# Patient Record
Sex: Male | Born: 1950
Health system: Southern US, Community
[De-identification: ages and names within clinical notes are randomized; demographics above are authoritative.]

## PROBLEM LIST (undated history)

## (undated) DIAGNOSIS — I10 Essential (primary) hypertension: Secondary | ICD-10-CM

---

## 2017-03-10 LAB — HM COLONOSCOPY

## 2017-10-22 ENCOUNTER — Encounter (HOSPITAL_COMMUNITY): Payer: Self-pay | Admitting: *Deleted

## 2017-10-22 ENCOUNTER — Other Ambulatory Visit: Payer: Self-pay

## 2017-10-22 DIAGNOSIS — M436 Torticollis: Secondary | ICD-10-CM | POA: Insufficient documentation

## 2017-10-22 DIAGNOSIS — M62838 Other muscle spasm: Secondary | ICD-10-CM | POA: Diagnosis not present

## 2017-10-22 DIAGNOSIS — I10 Essential (primary) hypertension: Secondary | ICD-10-CM | POA: Diagnosis not present

## 2017-10-22 DIAGNOSIS — M542 Cervicalgia: Secondary | ICD-10-CM | POA: Diagnosis present

## 2017-10-22 NOTE — ED Triage Notes (Signed)
Pt says he woke up with right sided neck pain. He took tylenol for pain without relief. Pain worse to moving his head right to left.

## 2017-10-23 ENCOUNTER — Emergency Department (HOSPITAL_COMMUNITY)
Admission: EM | Admit: 2017-10-23 | Discharge: 2017-10-23 | Disposition: A | Discharge status: Home / Self Care | Payer: Medicare Other | Attending: Emergency Medicine | Admitting: Emergency Medicine

## 2017-10-23 DIAGNOSIS — M62838 Other muscle spasm: Secondary | ICD-10-CM

## 2017-10-23 DIAGNOSIS — M436 Torticollis: Secondary | ICD-10-CM

## 2017-10-23 HISTORY — DX: Essential (primary) hypertension: I10

## 2017-10-23 MED ORDER — NAPROXEN SODIUM 220 MG PO TABS: 220.0000 mg | ORAL_TABLET | Freq: Two times a day (BID) | ORAL | 0 refills | Status: AC

## 2017-10-23 MED ORDER — IBUPROFEN 200 MG PO TABS
600.0000 mg | ORAL_TABLET | Freq: Once | ORAL | Status: AC | PRN
  Administered 2017-10-23: 600 mg via ORAL

## 2017-10-23 MED ORDER — NAPROXEN SODIUM 220 MG PO TABS: 220.0000 mg | ORAL_TABLET | Freq: Two times a day (BID) | ORAL | Status: DC

## 2017-10-23 MED ORDER — CYCLOBENZAPRINE HCL 5 MG PO TABS: 5.0000 mg | ORAL_TABLET | Freq: Every day | ORAL | 0 refills | Status: AC

## 2017-10-23 MED ORDER — IBUPROFEN 200 MG PO TABS
ORAL_TABLET | ORAL | Status: AC
  Administered 2017-10-23: 600 mg via ORAL
  Filled 2017-10-23: qty 3

## 2017-10-23 NOTE — ED Provider Notes (Signed)
[facility]-EMERGENCY DEPT Provider Note  CSN: [id number] Arrival date & time: 10/22/17 2324  Chief Complaint(s) Neck Pain  HPI Scott Saunders is a 67 y.o. male with a history of hypertension presents to the emergency department with approximately 20 to 24 hours of right-sided neck and shoulder pain.  He reports that it is achy and spastic in nature.  States that he noticed it when he awoke yesterday morning.  Believes it was due to working out the day before and sleeping on a soft mattress had a hotel.  Pain is exacerbated with turning his head and palpation of right shoulder musculature.  Patient tried taking Tylenol with minimal relief.  Denies any fevers or chills.  No headache.  No focal deficits.  No chest pain or shortness of breath.  Denies any other physical complaints.  HPI  Past Medical History Past Medical History:  Diagnosis Date  . Hypertension    There are no active problems to display for this patient.  Home Medication(s) Prior to Admission medications   Medication Sig Start Date End Date Taking? Authorizing Provider  cyclobenzaprine (FLEXERIL) 5 MG tablet Take 1 tablet (5 mg total) by mouth at bedtime for 10 days. 10/23/17 11/02/17  Nira Conn, MD  naproxen sodium (ALEVE) 220 MG tablet Take 1 tablet (220 mg total) by mouth 2 (two) times daily with a meal for 10 days. 10/23/17 11/02/17  Nira Conn, MD                                                                                                                                    Past Surgical History History reviewed. No pertinent surgical history. Family History No family history on file.  Social History Social History   Tobacco Use  . Smoking status: Never Smoker  . Smokeless tobacco: Never Used  Substance Use Topics  . Alcohol use: Never    Frequency: Never  . Drug use: Never   Allergies Patient has no known allergies.  Review of Systems Review of Systems As  noted in HPI Physical Exam Vital Signs  I have reviewed the triage vital signs BP (!) 182/81 (BP Location: Left Arm)   Pulse 66   Temp 98.5 F (36.9 C) (Oral)   Resp 20   SpO2 99%   Physical Exam  Constitutional: He is oriented to person, place, and time. He appears well-developed and well-nourished. No distress.  HENT:  Head: Normocephalic and atraumatic.  Right Ear: External ear normal.  Left Ear: External ear normal.  Nose: Nose normal.  Mouth/Throat: Mucous membranes are normal. No trismus in the jaw.  Eyes: Conjunctivae and EOM are normal. No scleral icterus.  Neck: Phonation normal. Muscular tenderness (spastic muscle) present. No spinous process tenderness present. Decreased range of motion (due to pain) present.    Cardiovascular: Normal rate and regular rhythm.  Pulmonary/Chest: Effort normal. No stridor. No respiratory distress.  Abdominal:  He exhibits no distension.  Musculoskeletal: He exhibits no edema.  Neurological: He is alert and oriented to person, place, and time.  Skin: He is not diaphoretic.  Psychiatric: He has a normal mood and affect. His behavior is normal.  Vitals reviewed.   ED Results and Treatments Labs (all labs ordered are listed, but only abnormal results are displayed) Labs Reviewed - No data to display                                                                                                                       EKG  EKG Interpretation  Date/Time:    Ventricular Rate:    PR Interval:    QRS Duration:   QT Interval:    QTC Calculation:   R Axis:     Text Interpretation:        Radiology No results found. Pertinent labs & imaging results that were available during my care of the patient were reviewed by me and considered in my medical decision making (see chart for details).  Medications Ordered in ED Medications  ibuprofen (ADVIL,MOTRIN) tablet 600 mg (600 mg Oral Given 10/23/17 0236)                                                                                                                                     Procedures Procedures  (including critical care time)  Medical Decision Making / ED Course I have reviewed the nursing notes for this encounter and the patient's prior records (if available in EHR or on provided paperwork).    Most suspicious for muscle spasm of the right trapezius muscle. Exam nonfocal. Doubt dissection. No fever. Doubt meningitis.  Motrin given.  The patient appears reasonably screened and/or stabilized for discharge and I doubt any other medical condition or other [facility] requiring further screening, evaluation, or treatment in the ED at this time prior to discharge.  The patient is safe for discharge with strict return precautions.   Final Clinical Impression(s) / ED Diagnoses Final diagnoses:  Torticollis  Muscle spasms of neck    Disposition: Discharge  Condition: Good  I have discussed the results, Dx and Tx plan with the patient who expressed understanding and agree(s) with the plan. Discharge instructions discussed at great length. The patient was given strict return precautions who verbalized understanding of the instructions. No further questions at time of discharge.    ED Discharge  Orders         Ordered    naproxen sodium (ALEVE) 220 MG tablet  2 times daily with meals     10/23/17 0347    cyclobenzaprine (FLEXERIL) 5 MG tablet  Daily at bedtime     10/23/17 0347           Follow Up: Primary care provider   in 1-2 weeks, If symptoms do not improve or  worsen     This chart was dictated using voice recognition software.  Despite best efforts to proofread,  errors can occur which can change the documentation meaning.   Nira Conn, MD 10/23/17 [phone]

## 2017-10-23 NOTE — ED Notes (Addendum)
Patient complaining of right neck pain with worsening pain when he turns his head to the right. Patient states he woke up with this pain. Patient is ambulatory with steady gait.

## 2017-10-23 NOTE — Discharge Instructions (Signed)
You may use over-the-counter Motrin (Ibuprofen), Acetaminophen (Tylenol), topical muscle creams such as SalonPas, Icy Hot, Bengay, etc. Please stretch, apply heat, and have massage therapy for additional assistance. ° °

## 2017-10-23 NOTE — ED Notes (Signed)
Pt given hot packs wrapped up in a towel around his neck. Pt reports much improvement to the pain.

## 2018-04-14 ENCOUNTER — Ambulatory Visit (INDEPENDENT_AMBULATORY_CARE_PROVIDER_SITE_OTHER): Payer: Medicare Other | Admitting: Family Medicine

## 2018-04-14 ENCOUNTER — Encounter: Payer: Self-pay | Admitting: Family Medicine

## 2018-04-14 VITALS — BP 130/68 | HR 72 | Temp 97.8°F | Ht 70.0 in | Wt 209.0 lb

## 2018-04-14 DIAGNOSIS — R350 Frequency of micturition: Secondary | ICD-10-CM | POA: Diagnosis not present

## 2018-04-14 DIAGNOSIS — K635 Polyp of colon: Secondary | ICD-10-CM | POA: Diagnosis not present

## 2018-04-14 DIAGNOSIS — I10 Essential (primary) hypertension: Secondary | ICD-10-CM

## 2018-04-14 DIAGNOSIS — M1990 Unspecified osteoarthritis, unspecified site: Secondary | ICD-10-CM

## 2018-04-14 DIAGNOSIS — N529 Male erectile dysfunction, unspecified: Secondary | ICD-10-CM

## 2018-04-14 MED ORDER — SILDENAFIL CITRATE 20 MG PO TABS: 20.0000 mg | ORAL_TABLET | Freq: Every day | ORAL | 1 refills | Status: AC | PRN

## 2018-04-14 MED ORDER — MELOXICAM 15 MG PO TABS: 15.0000 mg | ORAL_TABLET | Freq: Every day | ORAL | 2 refills | Status: AC

## 2018-04-14 MED ORDER — MELOXICAM 15 MG PO TABS: 15.0000 mg | ORAL_TABLET | Freq: Every day | ORAL | 2 refills | Status: DC

## 2018-04-14 MED ORDER — SILDENAFIL CITRATE 20 MG PO TABS: 20.0000 mg | ORAL_TABLET | Freq: Every day | ORAL | 1 refills | Status: DC | PRN

## 2018-04-14 MED FILL — SILDENAFIL CITRATE 20 MG TA: 20 | 10 days supply | Qty: 10 | Fill #0

## 2018-04-14 MED FILL — MELOXICAM 15 MG TABLET: 15 | 30 days supply | Qty: 30 | Fill #0

## 2018-04-14 NOTE — Patient Instructions (Addendum)
If you do not hear anything about your referrals in the next 1-2 weeks, call our office and ask for an update.  Keep the diet clean and stay active.  Let us know if you need anything. 

## 2018-04-14 NOTE — Progress Notes (Signed)
Chief Complaint  Patient presents with  . New Patient (Initial Visit)       New Patient Visit SUBJECTIVE: HPI: Scott Saunders is an 68 y.o.male who is being seen for establishing care.  The patient was previously seen in [place]  Pt has hx of freq urination. He was rx'd Ditropan XL 10 mg/d, but has not been taking it. He saw urology in [place], and wishes to see them down here. No bleeding or hx of BPH.   Hypertension Patient presents for hypertension follow up. He does not routinely monitor home blood pressures. He is compliant with medication-Norvasc 10 mg/d. Patient has these side effects of medication: none Denies CP or SOB  Pt has a hx of arthritis in many of his joints. He takes Tylenol and sometimes Aleve. No swelling.   Hx of ED. Take Sildenafil prn. No AE"s with medication.  Hx of   No Known Allergies  Past Medical History:  Diagnosis Date  . Hypertension    History reviewed. No pertinent surgical history. History reviewed. No pertinent family history. No Known Allergies  Current Outpatient Medications:  .  amLODipine (NORVASC) 10 MG tablet, Take 10 mg by mouth daily., Disp: , Rfl:  .  Multiple Vitamin (MULTIVITAMIN) tablet, Take 1 tablet by mouth daily., Disp: , Rfl:  .  oxybutynin (DITROPAN-XL) 10 MG 24 hr tablet, Take 10 mg by mouth at bedtime., Disp: , Rfl:  .  sildenafil (REVATIO) 20 MG tablet, Take 1 tablet (20 mg total) by mouth daily as needed., Disp: 10 tablet, Rfl: 1 .  meloxicam (MOBIC) 15 MG tablet, Take 1 tablet (15 mg total) by mouth daily., Disp: 30 tablet, Rfl: 2  ROS Const: no fevers MSK: +jt pain Neuro: No weakness Cardio: No CP Lungs: No sob GU: +freq GI: No bleeding Skin: No redness Eyes: No vision changes Endo: No wt loss  OBJECTIVE: BP 130/68 (BP Location: Left Arm, Patient Position: Sitting, Cuff Size: Normal)   Pulse 72   Temp 97.8 F (36.6 C) (Oral)   Ht 5\' 10"  (1.778 m)   Wt 209 lb (94.8 kg)   SpO2 97%   BMI 29.99 kg/m    Constitutional: -  VS reviewed -  Well developed, well nourished, appears stated age -  No apparent distress  Psychiatric: -  Oriented to person, place, and time -  Memory intact -  Affect and mood normal -  Fluent conversation, good eye contact -  Judgment and insight age appropriate  Eye: -  Conjunctivae clear, no discharge -  Pupils symmetric, round, reactive to light  ENMT: -  MMM    Pharynx moist, no exudate, no erythema  Neck: -  No gross swelling, no palpable masses -  Thyroid midline, not enlarged, mobile, no palpable masses  Cardiovascular: -  RRR -  No LE edema  Respiratory: -  Normal respiratory effort, no accessory muscle use, no retraction -  Breath sounds equal, no wheezes, no ronchi, no crackles  Gastrointestinal: -  Bowel sounds normal -  No tenderness, no distention, no guarding, no masses  Neurological:  -  CN II - XII grossly intact -  Sensation grossly intact to light touch, equal bilaterally  Musculoskeletal: -  No clubbing, no cyanosis -  Gait normal  Skin: -  No significant lesion on inspection -  Warm and dry to palpation   ASSESSMENT/PLAN: Arthritis - Plan: meloxicam (MOBIC) 15 MG tablet  Urinary frequency - Plan: oxybutynin (DITROPAN-XL) 10 MG 24 hr tablet, Ambulatory  referral to Urology  Erectile dysfunction, unspecified erectile dysfunction type - Plan: sildenafil (REVATIO) 20 MG tablet  Polyp of colon, unspecified part of colon, unspecified type - Plan: Ambulatory referral to Gastroenterology  Essential hypertension - Plan: amLODipine (NORVASC) 10 MG tablet  Patient instructed to sign release of records form from his previous PCP. Mobic prn, Tylenol. Physical activity encouraged.  Refer to urology per his request. Refill sildenafil. Refer to GI to f/u on abn colonoscopy (per pt). I told him that team will want his records.  Cont Norvasc. Counseled on diet and exercise.  Patient should return in 6 mo for CPE. The patient voiced  understanding and agreement to the plan.   Jilda Roche [place], DO 04/14/18  3:53 PM

## 2018-04-14 NOTE — Addendum Note (Signed)
Addended by: Scharlene Gloss B on: 04/14/2018 04:18 PM   Modules accepted: Orders

## 2018-04-26 ENCOUNTER — Telehealth: Payer: Self-pay | Admitting: Family Medicine

## 2018-04-26 NOTE — Telephone Encounter (Signed)
ROI fax to Dr Hoy Register for records

## 2018-05-11 ENCOUNTER — Encounter: Payer: Self-pay | Admitting: Family Medicine

## 2018-05-11 ENCOUNTER — Telehealth: Payer: Self-pay | Admitting: *Deleted

## 2018-05-11 NOTE — Telephone Encounter (Signed)
Received Medical records from Dr. Reesa Chew, MD, [facility], [facility]; forwarded to provider/SLS 03/25

## 2018-05-19 ENCOUNTER — Encounter: Payer: Self-pay | Admitting: Family Medicine

## 2018-05-26 MED FILL — SILDENAFIL CITRATE 20 MG TA: 20 | 10 days supply | Qty: 10 | Fill #1

## 2018-05-26 MED FILL — MELOXICAM 15 MG TABLET: 15 | 30 days supply | Qty: 30 | Fill #1

## 2018-05-31 MED FILL — OXYBUTYNIN CL ER 15 MG TAB: 15 | 30 days supply | Qty: 30 | Fill #0

## 2018-05-31 MED FILL — SILDENAFIL CITRATE 20 MG TA: 20 | 15 days supply | Qty: 30 | Fill #0

## 2018-07-05 ENCOUNTER — Other Ambulatory Visit: Payer: Self-pay | Admitting: Family Medicine

## 2018-07-05 DIAGNOSIS — I10 Essential (primary) hypertension: Secondary | ICD-10-CM

## 2018-07-05 MED ORDER — AMLODIPINE BESYLATE 10 MG PO TABS: 10.0000 mg | ORAL_TABLET | Freq: Every day | ORAL | 1 refills | Status: AC

## 2018-07-05 MED FILL — AMLODIPINE BESYLATE 10 MG T: 10 | 90 days supply | Qty: 90 | Fill #0

## 2018-07-05 NOTE — Telephone Encounter (Signed)
Copied from CRM [id number]. Topic: Quick Communication - Rx Refill/Question >> Jul 05, 2018 10:29 AM Baldo Daub L wrote: Medication: amLODipine (NORVASC) 10 MG tablet  Has the patient contacted their pharmacy? Yes - states he needs a new refill, he is completely out (Agent: If no, request that the patient contact the pharmacy for the refill.) (Agent: If yes, when and what did the pharmacy advise?)  Preferred Pharmacy (with phone number or street name): [facility] - [place], [place] - [phone] (Phone) [phone] (Fax)  Agent: Please be advised that RX refills may take up to 3 business days. We ask that you follow-up with your pharmacy.

## 2018-07-05 NOTE — Telephone Encounter (Signed)
Rx sent 

## 2018-07-19 MED FILL — OXYBUTYNIN CL ER 15 MG TAB: 15 | 30 days supply | Qty: 30 | Fill #1

## 2018-09-08 MED FILL — LISINOPRIL 5 MG TABLET: 5 | 90 days supply | Qty: 90 | Fill #0

## 2018-09-09 MED FILL — SILDENAFIL CITRATE 20 MG TA: 20 | 15 days supply | Qty: 30 | Fill #1

## 2018-10-13 ENCOUNTER — Ambulatory Visit: Payer: Medicare Other | Admitting: Family Medicine

## 2018-11-04 MED FILL — OXYBUTYNIN CL ER 15 MG TAB: 15 | 30 days supply | Qty: 30 | Fill #2

## 2018-11-04 MED FILL — GABAPENTIN 100 MG CAPSULE: 100 | 30 days supply | Qty: 90 | Fill #0

## 2018-11-04 MED FILL — HYDROCORTISONE 2.5% CREAM: 2.5 | 14 days supply | Qty: 30 | Fill #0

## 2018-11-05 ENCOUNTER — Emergency Department (HOSPITAL_BASED_OUTPATIENT_CLINIC_OR_DEPARTMENT_OTHER): Payer: Medicare Other

## 2018-11-05 ENCOUNTER — Other Ambulatory Visit: Payer: Self-pay

## 2018-11-05 ENCOUNTER — Encounter (HOSPITAL_BASED_OUTPATIENT_CLINIC_OR_DEPARTMENT_OTHER): Payer: Self-pay | Admitting: Emergency Medicine

## 2018-11-05 ENCOUNTER — Emergency Department (HOSPITAL_BASED_OUTPATIENT_CLINIC_OR_DEPARTMENT_OTHER)
Admission: EM | Admit: 2018-11-05 | Discharge: 2018-11-05 | Disposition: A | Discharge status: Home / Self Care | Payer: Medicare Other | Attending: Emergency Medicine | Admitting: Emergency Medicine

## 2018-11-05 DIAGNOSIS — I10 Essential (primary) hypertension: Secondary | ICD-10-CM | POA: Insufficient documentation

## 2018-11-05 DIAGNOSIS — Z7982 Long term (current) use of aspirin: Secondary | ICD-10-CM | POA: Insufficient documentation

## 2018-11-05 DIAGNOSIS — R51 Headache: Secondary | ICD-10-CM | POA: Diagnosis present

## 2018-11-05 DIAGNOSIS — Z79899 Other long term (current) drug therapy: Secondary | ICD-10-CM | POA: Diagnosis not present

## 2018-11-05 DIAGNOSIS — R42 Dizziness and giddiness: Secondary | ICD-10-CM | POA: Diagnosis not present

## 2018-11-05 DIAGNOSIS — G518 Other disorders of facial nerve: Secondary | ICD-10-CM | POA: Diagnosis not present

## 2018-11-05 LAB — URINALYSIS, ROUTINE W REFLEX MICROSCOPIC
Bilirubin Urine: NEGATIVE
Glucose, UA: NEGATIVE mg/dL
Ketones, ur: NEGATIVE mg/dL
Leukocytes,Ua: NEGATIVE
Nitrite: NEGATIVE
Protein, ur: NEGATIVE mg/dL
Specific Gravity, Urine: <1.005 — ABNORMAL LOW (ref 1.005–1.030)
pH: 7 (ref 5.0–8.0)

## 2018-11-05 LAB — BASIC METABOLIC PANEL
Anion gap: 10 (ref 5–15)
BUN: 17 mg/dL (ref 8–23)
CO2: 25 mmol/L (ref 22–32)
Calcium: 9.4 mg/dL (ref 8.9–10.3)
Chloride: 102 mmol/L (ref 98–111)
Creatinine, Ser: 1.28 mg/dL — ABNORMAL HIGH (ref 0.61–1.24)
GFR calc Af Amer: >60 mL/min (ref >60)
GFR calc non Af Amer: 56 mL/min — ABNORMAL LOW (ref >60)
Glucose, Bld: 111 mg/dL — ABNORMAL HIGH (ref 70–99)
Potassium: 3.8 mmol/L (ref 3.5–5.1)
Sodium: 137 mmol/L (ref 135–145)

## 2018-11-05 LAB — CBC WITH DIFFERENTIAL/PLATELET
Abs Immature Granulocytes: 0 K/uL (ref 0.00–0.07)
Basophils Absolute: 0 K/uL (ref 0.0–0.1)
Basophils Relative: 0 %
Eosinophils Absolute: 0.1 K/uL (ref 0.0–0.5)
Eosinophils Relative: 1 %
HCT: 36.4 % — ABNORMAL LOW (ref 39.0–52.0)
Hemoglobin: 11.6 g/dL — ABNORMAL LOW (ref 13.0–17.0)
Immature Granulocytes: 0 %
Lymphocytes Relative: 57 %
Lymphs Abs: 3.4 K/uL (ref 0.7–4.0)
MCH: 31.9 pg (ref 26.0–34.0)
MCHC: 31.9 g/dL (ref 30.0–36.0)
MCV: 100 fL (ref 80.0–100.0)
Monocytes Absolute: 0.5 K/uL (ref 0.1–1.0)
Monocytes Relative: 7 %
Neutro Abs: 2.2 K/uL (ref 1.7–7.7)
Neutrophils Relative %: 35 %
Platelets: 233 K/uL (ref 150–400)
RBC: 3.64 MIL/uL — ABNORMAL LOW (ref 4.22–5.81)
RDW: 11.5 % (ref 11.5–15.5)
WBC: 6.2 K/uL (ref 4.0–10.5)
nRBC: 0 % (ref 0.0–0.2)

## 2018-11-05 LAB — URINALYSIS, MICROSCOPIC (REFLEX)
Bacteria, UA: NONE SEEN
Squamous Epithelial / HPF: NONE SEEN (ref 0–5)
WBC, UA: NONE SEEN WBC/hpf (ref 0–5)

## 2018-11-05 LAB — CBG MONITORING, ED: Glucose-Capillary: 95 mg/dL (ref 70–99)

## 2018-11-05 LAB — SEDIMENTATION RATE: Sed Rate: 9 mm/h (ref 0–16)

## 2018-11-05 MED ORDER — DIAZEPAM 2 MG PO TABS
2.0000 mg | ORAL_TABLET | Freq: Once | ORAL | Status: AC
  Administered 2018-11-05: 2 mg via ORAL
  Filled 2018-11-05: qty 1

## 2018-11-05 MED ORDER — MECLIZINE HCL 25 MG PO TABS: 25.0000 mg | ORAL_TABLET | Freq: Three times a day (TID) | ORAL | 0 refills | Status: AC | PRN

## 2018-11-05 MED ORDER — DIPHENHYDRAMINE HCL 50 MG/ML IJ SOLN
25.0000 mg | Freq: Once | INTRAMUSCULAR | Status: AC
  Administered 2018-11-05: 25 mg via INTRAVENOUS
  Filled 2018-11-05: qty 1

## 2018-11-05 MED ORDER — HYDROCODONE-ACETAMINOPHEN 5-325 MG PO TABS: 1.0000 | ORAL_TABLET | Freq: Four times a day (QID) | ORAL | 0 refills | Status: AC | PRN

## 2018-11-05 MED ORDER — MECLIZINE HCL 25 MG PO TABS
25.0000 mg | ORAL_TABLET | Freq: Once | ORAL | Status: AC
  Administered 2018-11-05: 25 mg via ORAL
  Filled 2018-11-05: qty 1

## 2018-11-05 NOTE — ED Provider Notes (Addendum)
[facility] Provider Note: Lowella Dell, MD, FACEP  CSN: [id number] MRN: [id number] ARRIVAL: 11/05/18 at 0121 ROOM: MHOTF/OTF   CHIEF COMPLAINT  Headache and Dizziness   HISTORY OF PRESENT ILLNESS  11/05/18 3:05 AM Scott Saunders is a 68 y.o. male who has had pain in his left temple and left neck for the past 4 days.  The pain feels sharp and he rates it as an 8 out of 10.  It is worse with palpation of the left temple or the left neck.  He has had 2 episodes of vertigo since yesterday evening.  He characterizes these as the room spinning.  It is worse with certain positions of the head and better with certain positions of the head.  He denies nausea or vomiting.  He denies focal weakness.   Past Medical History:  Diagnosis Date   Hypertension     History reviewed. No pertinent surgical history.  No family history on file.  Social History   Tobacco Use   Smoking status: Never Smoker   Smokeless tobacco: Never Used  Substance Use Topics   Alcohol use: Never    Frequency: Never   Drug use: Never    Prior to Admission medications   Medication Sig Start Date End Date Taking? Authorizing Provider  gabapentin (NEURONTIN) 100 MG capsule Take by mouth. 11/04/18 12/04/18 Yes [provider]  tadalafil (CIALIS) 5 MG tablet Take by mouth. 10/13/18  Yes [provider]  amLODipine (NORVASC) 10 MG tablet Take 1 tablet (10 mg total) by mouth daily. 07/05/18   Sharlene Dory, DO  aspirin EC 81 MG tablet Take by mouth.    [provider]  gabapentin (NEURONTIN) 100 MG capsule  11/04/18   [provider]  HYDROcodone-acetaminophen (NORCO) 5-325 MG tablet Take 1 tablet by mouth every 6 (six) hours as needed for severe pain. 11/05/18   Macarius Ruark, MD  lisinopril (ZESTRIL) 5 MG tablet  09/08/18   [provider]  meclizine (ANTIVERT) 25 MG tablet Take 1 tablet (25 mg total) by mouth 3 (three) times daily as needed for  dizziness. 11/05/18   Yamir Carignan, MD  meloxicam (MOBIC) 15 MG tablet Take 1 tablet (15 mg total) by mouth daily. 04/14/18   Sharlene Dory, DO  Multiple Vitamin (MULTIVITAMIN) tablet Take 1 tablet by mouth daily.    [provider]  oxybutynin (DITROPAN XL) 15 MG 24 hr tablet  11/04/18   [provider]  oxybutynin (DITROPAN-XL) 10 MG 24 hr tablet Take 10 mg by mouth at bedtime.    [provider]  sildenafil (REVATIO) 20 MG tablet Take 1 tablet (20 mg total) by mouth daily as needed. 04/14/18   Sharlene Dory, DO    Allergies Patient has no known allergies.   REVIEW OF SYSTEMS  Negative except as noted here or in the History of Present Illness.   PHYSICAL EXAMINATION  Initial Vital Signs Blood pressure 128/73, pulse (!) 51, resp. rate 13, height 5\' 7"  (1.702 m), weight 95.3 kg, SpO2 96 %.  Examination General: Well-developed, well-nourished male in no acute distress; appearance consistent with age of record HENT: normocephalic; atraumatic; TMs normal; tenderness of the left temple Eyes: pupils equal, round and reactive to light; extraocular muscles intact; slight nystagmus, worse with movement of the head Neck: supple; pain reproduced with movement of the neck or with palpation of the left neck Heart: regular rate and rhythm Lungs: clear to auscultation bilaterally Abdomen: soft; nondistended;  nontender; bowel sounds present Extremities: No deformity; full range of motion; pulses normal; trace edema of lower legs Neurologic: Awake, alert and oriented; motor function intact in all extremities and symmetric; no facial droop Skin: Warm and dry Psychiatric: Normal mood and affect   RESULTS  Summary of this visit's results, reviewed by myself:   EKG Interpretation  Date/Time:  Saturday November 05 2018 01:35:40 EDT Ventricular Rate:  57 PR Interval:    QRS Duration: 88 QT Interval:  414 QTC Calculation: 404 R Axis:   24 Text  Interpretation:  Sinus rhythm Borderline prolonged PR interval Abnormal R-wave progression, early transition Borderline T abnormalities, inferior leads No previous ECGs available Confirmed by Paula LibraMolpus, Alvia Jablonski ([id number]) on 11/05/2018 1:52:54 AM      Laboratory Studies: Results for orders placed or performed during the hospital encounter of 11/05/18 (from the past 24 hour(s))  CBG monitoring, ED     Status: None   Collection Time: 11/05/18  1:38 AM  Result Value Ref Range   Glucose-Capillary 95 70 - 99 mg/dL  Urinalysis, Routine w reflex microscopic     Status: Abnormal   Collection Time: 11/05/18  1:40 AM  Result Value Ref Range   Color, Urine YELLOW YELLOW   APPearance CLEAR CLEAR   Specific Gravity, Urine <1.005 (L) 1.005 - 1.030   pH 7.0 5.0 - 8.0   Glucose, UA NEGATIVE NEGATIVE mg/dL   Hgb urine dipstick TRACE (A) NEGATIVE   Bilirubin Urine NEGATIVE NEGATIVE   Ketones, ur NEGATIVE NEGATIVE mg/dL   Protein, ur NEGATIVE NEGATIVE mg/dL   Nitrite NEGATIVE NEGATIVE   Leukocytes,Ua NEGATIVE NEGATIVE  Urinalysis, Microscopic (reflex)     Status: None   Collection Time: 11/05/18  1:40 AM  Result Value Ref Range   RBC / HPF 0-5 0 - 5 RBC/hpf   WBC, UA NONE SEEN 0 - 5 WBC/hpf   Bacteria, UA NONE SEEN NONE SEEN   Squamous Epithelial / LPF NONE SEEN 0 - 5  Sedimentation rate     Status: None   Collection Time: 11/05/18  1:44 AM  Result Value Ref Range   Sed Rate 9 0 - 16 mm/hr  CBC with Differential/Platelet     Status: Abnormal   Collection Time: 11/05/18  1:44 AM  Result Value Ref Range   WBC 6.2 4.0 - 10.5 K/uL   RBC 3.64 (L) 4.22 - 5.81 MIL/uL   Hemoglobin 11.6 (L) 13.0 - 17.0 g/dL   HCT 60.436.4 (L) 54.039.0 - 98.152.0 %   MCV 100.0 80.0 - 100.0 fL   MCH 31.9 26.0 - 34.0 pg   MCHC 31.9 30.0 - 36.0 g/dL   RDW 19.111.5 47.811.5 - 29.515.5 %   Platelets 233 150 - 400 K/uL   nRBC 0.0 0.0 - 0.2 %   Neutrophils Relative % 35 %   Neutro Abs 2.2 1.7 - 7.7 K/uL   Lymphocytes Relative 57 %   Lymphs Abs 3.4 0.7  - 4.0 K/uL   Monocytes Relative 7 %   Monocytes Absolute 0.5 0.1 - 1.0 K/uL   Eosinophils Relative 1 %   Eosinophils Absolute 0.1 0.0 - 0.5 K/uL   Basophils Relative 0 %   Basophils Absolute 0.0 0.0 - 0.1 K/uL   Immature Granulocytes 0 %   Abs Immature Granulocytes 0.00 0.00 - 0.07 K/uL  Basic metabolic panel     Status: Abnormal   Collection Time: 11/05/18  1:44 AM  Result Value Ref Range   Sodium 137 135 - 145  mmol/L   Potassium 3.8 3.5 - 5.1 mmol/L   Chloride 102 98 - 111 mmol/L   CO2 25 22 - 32 mmol/L   Glucose, Bld 111 (H) 70 - 99 mg/dL   BUN 17 8 - 23 mg/dL   Creatinine, Ser 1.28 (H) 0.61 - 1.24 mg/dL   Calcium 9.4 8.9 - 10.3 mg/dL   GFR calc non Af Amer 56 (L) >60 mL/min   GFR calc Af Amer >60 >60 mL/min   Anion gap 10 5 - 15   Imaging Studies: Ct Head Wo Contrast  Result Date: 11/05/2018 CLINICAL DATA:  Headache and dizziness EXAM: CT HEAD WITHOUT CONTRAST TECHNIQUE: Contiguous axial images were obtained from the base of the skull through the vertex without intravenous contrast. COMPARISON:  None. FINDINGS: Brain: There is no mass, hemorrhage or extra-axial collection. The size and configuration of the ventricles and extra-axial CSF spaces are normal. There is hypoattenuation of the white matter, most commonly indicating chronic small vessel disease. Vascular: No abnormal hyperdensity of the major intracranial arteries or dural venous sinuses. No intracranial atherosclerosis. Skull: The visualized skull base, calvarium and extracranial soft tissues are normal. Sinuses/Orbits: No fluid levels or advanced mucosal thickening of the visualized paranasal sinuses. No mastoid or middle ear effusion. The orbits are normal. IMPRESSION: Chronic small vessel disease without acute intracranial abnormality. Electronically Signed   By: Ulyses Jarred M.D.   On: 11/05/2018 02:35   Mr Brain Wo Contrast  Result Date: 11/05/2018 CLINICAL DATA:  Left-sided head, facial, and neck pain for 2 days.  Vertigo. EXAM: MRI HEAD WITHOUT CONTRAST TECHNIQUE: Multiplanar, multiecho pulse sequences of the brain and surrounding structures were obtained without intravenous contrast. COMPARISON:  Head CT 11/05/2018 FINDINGS: Brain: There is no evidence of acute infarct, intracranial hemorrhage, mass, midline shift, or extra-axial fluid collection. The ventricles and sulci are within normal limits for age. Patchy T2 hyperintensities in the cerebral white matter bilaterally are nonspecific but compatible with moderate chronic small vessel ischemic disease. No cerebellar insult is identified. Vascular: Major intracranial vascular flow voids are preserved. Skull and upper cervical spine: Unremarkable bone marrow signal. Sinuses/Orbits: Unremarkable orbits. Paranasal sinuses and mastoid air cells are clear. Other: None. IMPRESSION: 1. No acute intracranial abnormality. 2. Moderate chronic small vessel ischemic disease. Electronically Signed   By: Logan Bores M.D.   On: 11/05/2018 10:40    ED COURSE and MDM  Nursing notes and initial vitals signs, including pulse oximetry, reviewed.  Vitals:   11/05/18 0900 11/05/18 0958 11/05/18 1000 11/05/18 1151  BP: (!) 150/80 (!) 146/77 (!) 154/79 138/70  Pulse: (!) 52 (!) 51 (!) 51 (!) 52  Resp: (!) 7 16 20    Temp:  98 F (36.7 C)  98 F (36.7 C)  TempSrc:  Oral  Oral  SpO2: 99% 99% 97% 98%  Weight:      Height:       Patient's vertigo significantly improved after IV Benadryl.  Will discharge on meclizine.  It is unclear if the pain in his head and neck are related to his vertigo.  The pattern is not typical for temporal arteritis and his sed rate is normal.   5:59 AM Patient awaiting MRI brain.  PROCEDURES    ED DIAGNOSES     ICD-10-CM   1. Neuralgic facial pain  G51.8   2. Vertigo  R42        Elonda Giuliano, MD 11/05/18 0447    Shanon Rosser, MD 11/05/18 2238

## 2018-11-05 NOTE — ED Notes (Signed)
ED Provider at bedside. 

## 2018-11-05 NOTE — ED Notes (Signed)
Pt returned from MRI. Reports mild headache

## 2018-11-05 NOTE — ED Triage Notes (Signed)
Left sided headache and ear ache for 3 days with dizziness. Increased pain tonight, with vertigo.Speech clear.

## 2018-11-05 NOTE — ED Notes (Signed)
Spoke with pt and his wife and they want to stay here and have a MRI in the morning

## 2018-11-05 NOTE — Discharge Instructions (Addendum)
Please take the medication as prescribed.  Please follow-up with your neurologist on Wednesday as previously scheduled.  If you develop worsening symptoms, inability to walk, vomiting, or other new concerning symptom, recommend return here for recheck.

## 2018-11-05 NOTE — ED Notes (Signed)
Patient transported to CT 

## 2018-11-05 NOTE — ED Provider Notes (Signed)
Signout note  68-year-old male presents to the ER with vertigo.  Work-up thus far including CT head negative, ESR negative.  ED provider feels most likely diagnosis peripheral in nature.  MRI ordered to rule out central lesion.  7:11 AM received signout from Molpus, disposition pending MRI  10:45 AM MRI negative, suspect peripheral cause for vertigo.  Reviewed MRI results with patient, patient symptoms had improved from initial presentation but still having some persistent headache and dizziness.  Tolerating p.o.  Will give single dose Valium and discharged with Rx for meclizine.  Patient has follow-up already scheduled with neurology.  Also recommended recheck with PCP.  Believe appropriate for discharge and outpatient management.    After the discussed management above, the patient was determined to be safe for discharge.  The patient was in agreement with this plan and all questions regarding their care were answered.  ED return precautions were discussed and the patient will return to the ED with any significant worsening of condition.     Dykstra, Richard S, MD 11/05/18 1318  

## 2018-11-05 NOTE — ED Notes (Signed)
Pt to MRI

## 2018-11-05 NOTE — ED Notes (Signed)
Pt started on Gabapentin yesterday by PMD for same sx.

## 2018-11-05 NOTE — ED Notes (Signed)
Pt resting comfortably. C/O dizziness with moving head.

## 2018-11-14 MED FILL — AMLODIPINE BESYLATE 10 MG T: 10 | 90 days supply | Qty: 90 | Fill #1

## 2019-05-20 DIAGNOSIS — G4733 Obstructive sleep apnea (adult) (pediatric): Secondary | ICD-10-CM | POA: Diagnosis not present

## 2019-05-27 ENCOUNTER — Ambulatory Visit: Payer: Medicare Other

## 2019-05-29 DIAGNOSIS — I10 Essential (primary) hypertension: Secondary | ICD-10-CM | POA: Diagnosis not present

## 2019-05-29 DIAGNOSIS — R682 Dry mouth, unspecified: Secondary | ICD-10-CM | POA: Diagnosis not present

## 2019-05-29 DIAGNOSIS — M4807 Spinal stenosis, lumbosacral region: Secondary | ICD-10-CM | POA: Diagnosis not present

## 2019-05-29 DIAGNOSIS — M48061 Spinal stenosis, lumbar region without neurogenic claudication: Secondary | ICD-10-CM | POA: Diagnosis not present

## 2019-05-29 DIAGNOSIS — M545 Low back pain: Secondary | ICD-10-CM | POA: Diagnosis not present

## 2019-05-29 DIAGNOSIS — R21 Rash and other nonspecific skin eruption: Secondary | ICD-10-CM | POA: Diagnosis not present

## 2019-05-29 DIAGNOSIS — R718 Other abnormality of red blood cells: Secondary | ICD-10-CM | POA: Diagnosis not present

## 2019-06-06 DIAGNOSIS — I10 Essential (primary) hypertension: Secondary | ICD-10-CM | POA: Diagnosis not present

## 2019-06-06 DIAGNOSIS — E669 Obesity, unspecified: Secondary | ICD-10-CM | POA: Diagnosis not present

## 2019-06-12 DIAGNOSIS — M47816 Spondylosis without myelopathy or radiculopathy, lumbar region: Secondary | ICD-10-CM | POA: Diagnosis not present

## 2019-06-19 DIAGNOSIS — G4733 Obstructive sleep apnea (adult) (pediatric): Secondary | ICD-10-CM | POA: Diagnosis not present

## 2019-06-29 DIAGNOSIS — N138 Other obstructive and reflux uropathy: Secondary | ICD-10-CM | POA: Diagnosis not present

## 2019-06-29 DIAGNOSIS — N401 Enlarged prostate with lower urinary tract symptoms: Secondary | ICD-10-CM | POA: Diagnosis not present

## 2019-06-29 DIAGNOSIS — R350 Frequency of micturition: Secondary | ICD-10-CM | POA: Diagnosis not present

## 2019-07-20 DIAGNOSIS — G4733 Obstructive sleep apnea (adult) (pediatric): Secondary | ICD-10-CM | POA: Diagnosis not present

## 2019-11-15 ENCOUNTER — Emergency Department (HOSPITAL_COMMUNITY)
Admission: EM | Admit: 2019-11-15 | Discharge: 2019-11-15 | Disposition: A | Discharge status: Home / Self Care | Payer: No Typology Code available for payment source | Attending: Emergency Medicine | Admitting: Emergency Medicine

## 2019-11-15 ENCOUNTER — Encounter (HOSPITAL_COMMUNITY): Payer: Self-pay

## 2019-11-15 ENCOUNTER — Other Ambulatory Visit: Payer: Self-pay

## 2019-11-15 DIAGNOSIS — R519 Headache, unspecified: Secondary | ICD-10-CM | POA: Diagnosis not present

## 2019-11-15 DIAGNOSIS — Z5321 Procedure and treatment not carried out due to patient leaving prior to being seen by health care provider: Secondary | ICD-10-CM | POA: Diagnosis not present

## 2019-11-15 DIAGNOSIS — M542 Cervicalgia: Secondary | ICD-10-CM | POA: Diagnosis not present

## 2019-11-15 NOTE — ED Notes (Signed)
After I took patient vitals I explain the nurse would be in to triage him and he stated " he did not want to see a nurse he wanted to see a medical doctor".   I explained the process and he stated again the samething

## 2019-11-15 NOTE — ED Triage Notes (Signed)
Pt presents with c/o headache for one week. Pt reports the pain is in his head and neck. Pt reports that he needs to see a neurologist. Pt reports its "inside his head".

## 2019-11-15 NOTE — ED Notes (Signed)
Pt eloped from waiting area. Called 3X.  

## 2019-11-16 ENCOUNTER — Emergency Department (HOSPITAL_BASED_OUTPATIENT_CLINIC_OR_DEPARTMENT_OTHER)
Admission: EM | Admit: 2019-11-16 | Discharge: 2019-11-16 | Disposition: A | Discharge status: Home / Self Care | Payer: No Typology Code available for payment source | Attending: Emergency Medicine | Admitting: Emergency Medicine

## 2019-11-16 ENCOUNTER — Other Ambulatory Visit: Payer: Self-pay

## 2019-11-16 ENCOUNTER — Encounter (HOSPITAL_BASED_OUTPATIENT_CLINIC_OR_DEPARTMENT_OTHER): Payer: Self-pay | Admitting: *Deleted

## 2019-11-16 DIAGNOSIS — I10 Essential (primary) hypertension: Secondary | ICD-10-CM | POA: Diagnosis not present

## 2019-11-16 DIAGNOSIS — Z7982 Long term (current) use of aspirin: Secondary | ICD-10-CM | POA: Diagnosis not present

## 2019-11-16 DIAGNOSIS — Z79899 Other long term (current) drug therapy: Secondary | ICD-10-CM | POA: Insufficient documentation

## 2019-11-16 DIAGNOSIS — G500 Trigeminal neuralgia: Secondary | ICD-10-CM | POA: Insufficient documentation

## 2019-11-16 DIAGNOSIS — R519 Headache, unspecified: Secondary | ICD-10-CM | POA: Diagnosis present

## 2019-11-16 DIAGNOSIS — G518 Other disorders of facial nerve: Secondary | ICD-10-CM

## 2019-11-16 MED ORDER — KETOROLAC TROMETHAMINE 60 MG/2ML IM SOLN
60.0000 mg | Freq: Once | INTRAMUSCULAR | Status: AC
  Administered 2019-11-16: 60 mg via INTRAMUSCULAR
  Filled 2019-11-16: qty 2

## 2019-11-16 MED ORDER — ACETAMINOPHEN 325 MG PO TABS
650.0000 mg | ORAL_TABLET | Freq: Once | ORAL | Status: AC
  Administered 2019-11-16: 650 mg via ORAL
  Filled 2019-11-16: qty 2

## 2019-11-16 MED ORDER — OXYCODONE HCL 5 MG PO TABS
5.0000 mg | ORAL_TABLET | Freq: Once | ORAL | Status: AC
  Administered 2019-11-16: 5 mg via ORAL
  Filled 2019-11-16: qty 1

## 2019-11-16 MED ORDER — OXYCODONE HCL 5 MG PO TABS: 5.0000 mg | ORAL_TABLET | ORAL | 0 refills | Status: AC | PRN

## 2019-11-16 MED FILL — oxyCODONE HCL 5 MG TABS: 5 | 1 days supply | Qty: 3 | Fill #0

## 2019-11-16 NOTE — ED Provider Notes (Signed)
[facility] EMERGENCY DEPARTMENT Provider Note   CSN: [id number] Arrival date & time: 11/16/19  1024     History Chief Complaint  Patient presents with  . Headache  . Neck Pain    Scott Saunders is a 69 y.o. male.  HPI 69 year old male with a history of hypertension, enlarged prostate, apparent occipital neuralgia per note revealed presents to the ER with complaints of 1 week of left-sided headache that travels into his neck. Pt describes the pain as constant and throbbing. Worse with neck movement. Per chart review and patient report, he was most recently evaluated by [facility] and has been prescribed 800mg  Gabapentin TID. He also received Occipital nerve block which helped mildly but did not take his pain completely away.  He states he is very concerned that he has never had any imaging of his head done and that he is worried about the blood flow to his brain.  He endorses some intermittent dizziness, however no nausea or vomiting.  Denies any recent head injuries.  Per chart review, patient has presented to multiple medical providers with similar complaints.  He was seen here approximately a year ago in the ER and underwent a CT of the head and MRI of the head which showed some chronic microvascular ischemic changes but nothing acute.  Patient has expressed dissatisfaction with his current neurologist and states that he is not looking into this problem more.  He states that the pain got so severe yesterday that [place] and had to go to the ER.  He states that the wait was 5 hours and thus he eloped.  He denies any new changes to his symptoms.  States that this feels like his prior pain.  He denies any facial droop, difficulty speaking, unilateral weakness.  He is not on blood thinners.  He denies any syncope.  He denies any recent fevers or chills, but does endorse some neck stiffness.    Past Medical History:  Diagnosis Date  .  Hypertension     Patient Active Problem List   Diagnosis Date Noted  . Arthritis 04/14/2018  . Urinary frequency 04/14/2018  . Erectile dysfunction 04/14/2018  . Polyp of colon 04/14/2018  . Essential hypertension 04/14/2018    History reviewed. No pertinent surgical history.     No family history on file.  Social History   Tobacco Use  . Smoking status: Never Smoker  . Smokeless tobacco: Never Used  Substance Use Topics  . Alcohol use: Never  . Drug use: Never    Home Medications Prior to Admission medications   Medication Sig Start Date End Date Taking? Authorizing Provider  acetaminophen (TYLENOL) 500 MG tablet Take by mouth.   Yes [provider]  amLODipine (NORVASC) 10 MG tablet Take 1 tablet (10 mg total) by mouth daily. 07/05/18  Yes Sharlene DoryWendling, Nicholas Paul, DO  aspirin EC 81 MG tablet Take by mouth.   Yes [provider]  BEPREVE 1.5 % SOLN SMARTSIG:1 Drop(s) In Eye(s) Twice Daily PRN 10/16/19  Yes [provider]  clobetasol cream (TEMOVATE) 0.05 % Apply in affected area daily 05/29/19  Yes [provider]  Docusate Sodium (DSS) 100 MG CAPS Take by mouth.   Yes [provider]  gabapentin (NEURONTIN) 100 MG capsule  11/04/18  Yes [provider]  lisinopril (ZESTRIL) 20 MG tablet Take by mouth. 08/28/19  Yes [provider]  lisinopril (ZESTRIL) 5 MG tablet  09/08/18  Yes [provider]  Multiple Vitamin (MULTIVITAMIN) tablet Take 1 tablet by mouth daily.   Yes [provider]  tamsulosin (FLOMAX) 0.4 MG CAPS capsule Take by mouth. 09/25/19  Yes [provider]  gabapentin (NEURONTIN) 100 MG capsule Take by mouth. 11/04/18 12/04/18  [provider]  gabapentin (NEURONTIN) 100 MG capsule Take by mouth.    [provider]  HYDROcodone-acetaminophen (NORCO) 5-325 MG tablet Take 1 tablet by mouth every 6 (six) hours as needed for severe pain. 11/05/18   Molpus, John, MD   meclizine (ANTIVERT) 25 MG tablet Take 1 tablet (25 mg total) by mouth 3 (three) times daily as needed for dizziness. 11/05/18   Molpus, John, MD  meloxicam (MOBIC) 15 MG tablet Take 1 tablet (15 mg total) by mouth daily. 04/14/18   Sharlene Dory, DO  oxybutynin (DITROPAN XL) 15 MG 24 hr tablet  11/04/18   [provider]  oxybutynin (DITROPAN XL) 15 MG 24 hr tablet Take by mouth.    [provider]  oxybutynin (DITROPAN-XL) 10 MG 24 hr tablet Take 10 mg by mouth at bedtime.    [provider]  oxyCODONE (ROXICODONE) 5 MG immediate release tablet Take 1 tablet (5 mg total) by mouth every 4 (four) hours as needed for severe pain. 11/16/19   Mare Ferrari, PA-C  sildenafil (REVATIO) 20 MG tablet Take 1 tablet (20 mg total) by mouth daily as needed. 04/14/18   Sharlene Dory, DO  tadalafil (CIALIS) 5 MG tablet Take by mouth. 10/13/18   [provider]    Allergies    Patient has no known allergies.  Review of Systems   Review of Systems  Constitutional: Negative for chills and fever.  Musculoskeletal: Positive for neck pain. Negative for neck stiffness.  Neurological: Positive for dizziness and headaches. Negative for seizures, syncope, speech difficulty, weakness and numbness.    Physical Exam Updated Vital Signs BP (!) 144/72 (BP Location: Right Arm)   Pulse 64   Temp 98.6 F (37 C) (Oral)   Resp 16   Ht 5\' 7"  (1.702 m)   Wt 95.3 kg   SpO2 100%   BMI 32.89 kg/m   Physical Exam Vitals and nursing note reviewed.  Constitutional:      Appearance: He is well-developed. He is not ill-appearing or diaphoretic.  HENT:     Head: Normocephalic and atraumatic.     Comments: Reproducible point tenderness to the lateral left neck, no midline tenderness of the C-spine, tenderness to palpation over the left occipital area of the scalp as well.  No noticeable step-offs or crepitus.  No evidence of erythema, swelling, warmth.  He has 2+  carotid pulses and 2+ radial pulses bilaterally. Eyes:     General: No visual field deficit or scleral icterus.    Extraocular Movements: Extraocular movements intact.     Conjunctiva/sclera: Conjunctivae normal.     Pupils: Pupils are equal, round, and reactive to light. Pupils are equal.  Neck:     Meningeal: Brudzinski's sign and Kernig's sign absent.  Cardiovascular:     Rate and Rhythm: Normal rate and regular rhythm.     Heart sounds: Normal heart sounds. No murmur heard.   Pulmonary:     Effort: Pulmonary effort is normal. No respiratory distress.     Breath sounds: Normal breath sounds.  Abdominal:     Palpations: Abdomen is soft.     Tenderness: There is no abdominal tenderness.  Musculoskeletal:  Cervical back: Normal range of motion and neck supple. No rigidity.  Lymphadenopathy:     Cervical: No cervical adenopathy.  Skin:    General: Skin is warm and dry.  Neurological:     Mental Status: He is alert.     Cranial Nerves: No cranial nerve deficit or dysarthria.     Sensory: No sensory deficit.     Motor: No weakness.     Coordination: Romberg sign negative.     Comments: Mental Status:  Alert, thought content appropriate, able to give a coherent history. Speech fluent without evidence of aphasia. Able to follow 2 step commands without difficulty.  Cranial Nerves:  II: Peripheral visual fields grossly normal, pupils equal, round, reactive to light III,IV, VI: ptosis not present, extra-ocular motions intact bilaterally  V,VII: smile symmetric, facial light touch sensation equal VIII: hearing grossly normal to voice  X: uvula elevates symmetrically  XI: bilateral shoulder shrug symmetric and strong XII: midline tongue extension without fassiculations Motor:  Normal tone. 5/5 strength of BUE and BLE major muscle groups including strong and equal grip strength and dorsiflexion/plantar flexion Sensory: light touch normal in all extremities. Cerebellar: normal  finger-to-nose with bilateral upper extremities, Romberg sign absent Gait: not accessed.    Psychiatric:        Mood and Affect: Mood normal.        Behavior: Behavior normal.     ED Results / Procedures / Treatments   Labs (all labs ordered are listed, but only abnormal results are displayed) Labs Reviewed - No data to display  EKG None  Radiology No results found.  Procedures Procedures (including critical care time)  Medications Ordered in ED Medications  ketorolac (TORADOL) injection 60 mg (has no administration in time range)  acetaminophen (TYLENOL) tablet 650 mg (has no administration in time range)  oxyCODONE (Oxy IR/ROXICODONE) immediate release tablet 5 mg (has no administration in time range)    ED Course  I have reviewed the triage vital signs and the nursing notes.  Pertinent labs & imaging results that were available during my care of the patient were reviewed by me and considered in my medical decision making (see chart for details).    MDM Rules/Calculators/A&P                          69 year old male with complaints of 1 week of left-sided headache, neck pain. On presentation, he is alert, oriented, nontoxic-appearing, no acute distress, with no visible gross neuro deficits.  Vitals are overall reassuring.  Physical exam without any neurologic deficits noted.  He does have some reproducible tenderness to the left lateral side of the neck, and some point tenderness to the occipital area of the scalp.  He has a negative cardiac emergencies, low concern for meningitis.  Symptoms have been ongoing for multiple years, as early as 2018 per chart review despite the patient stating that this has been ongoing for a week.  He has been evaluated by neurology.  Patient reports that he has never had any imaging done, however I personally reviewed his CT scan and MRI of his head from a year ago which showed chronic microvascular changes but nothing acute.  Per chart  review, I do not see a CTA of the head and neck, however again given the longstanding nature of his pain, my concern for a venous thrombosis or a carotid dissection is low.  Patient was also seen and evaluated by Dr. Adela Lank.  He feels as though the patient could have a work-up done in an outpatient setting.  He discussed this with the patient who is agreeable.  Will provide Tylenol, Toradol and oxycodone here and send home with several Roxicodone per Dr. Adela Lank, and will refer to a new neurologist as the patient has expressed that he is not happy with his current 1.  Return precautions discussed.  The patient and his wife voiced understanding and are agreeable.  At this stage in the ED course, the patient has been medically screened and stable for discharge. Final Clinical Impression(s) / ED Diagnoses Final diagnoses:  Neuralgic facial pain    Rx / DC Orders ED Discharge Orders         Ordered    oxyCODONE (ROXICODONE) 5 MG immediate release tablet  Every 4 hours PRN        11/16/19 1618           Mare Ferrari, PA-C 11/16/19 1619    Melene Plan, DO 11/16/19 1819

## 2019-11-16 NOTE — ED Triage Notes (Signed)
Pain in his head and neck for a week. Pain is work when he turns his head to the left the pain increases. Limited movement. He feel like he has water in his left ear.

## 2019-11-16 NOTE — Discharge Instructions (Addendum)
Thank you for letting us take your review in the ER today  As discussed with Dr. Adela Lank, please make sure to follow-up with the neurologist whose contact information I have provided. Return to the ER for any new or worsening symptoms

## 2019-11-16 NOTE — ED Notes (Signed)
Pt states he wants to see a dr today, was seen at [facility] yesterday and only saw nurse her states

## 2019-11-16 NOTE — ED Notes (Signed)
H/a x 4 days  No blurred vision . No n/v   Has felt unsteady on feet but has not fallen he states  Mostly left sided h/a  Goes into neck and left shoulder

## 2019-11-21 ENCOUNTER — Encounter: Payer: Self-pay | Admitting: Neurology

## 2019-12-04 ENCOUNTER — Ambulatory Visit: Payer: Medicare HMO | Admitting: Medical

## 2019-12-07 NOTE — Progress Notes (Signed)
NEUROLOGY CONSULTATION NOTE  Scott Saunders MRN: [id number] DOB: May 08, 1950  Referring provider: Kennis Carina, MD (ED referral) Primary care provider: No PCP  Reason for consult:  headache  HISTORY OF PRESENT ILLNESS: Scott Saunders is a 69 year old right-handed male with HTN who presents for headache and neck pain.  History supplemented by ED and prior neurologist's notes.  In 2020, he began experiencing severe sharp pain in the left occipital region with shooting pain radiating down the neck and into the left shoulder  No burning, tingling or numbness of the scalp.  Moving his neck in all directions, especially flexion while reading, aggravates it.  No pain, numbness or weakness into the left arm or hand.  Cervical spine X-ray from 09/08/2018 showed moderate spondylosis with moderate multilevel disc disease from C3-4 level to C5-6 level.  MRI of brain from 11/05/2018 personally reviewed showed chronic small vessel ischemic changes but otherwise unremarkable.  He established care with neurology at [facility]  He received an occipital nerve block in September 2020, which resolved the pain for about 8 months but then returned.  He had a repeat occipital nerve block on 11/07/2019, but this time it was ineffective.  He was seen in the ED on 11/16/2019 where he was treated with Toradol injection, oxycodoone and Tylenol.  To further evaluate left arm pain, he underwent NCV-EMG on 12/08/2018 which was normal.  He was referred to a orthopedics for suspected shoulder etiology.  MRI of left shoulder showed evidence of tendonitis in addition to tear of the supraspinatus tendon was told that he needs shoulder surgery, but patient has declined.    He is currently taking gabapentin 200mg  at bedtime.  He has not tried physical therapy.  CBC, CMP and BMP from April and July reviewed.  PAST MEDICAL HISTORY: Past Medical History:  Diagnosis Date  . Hypertension     PAST SURGICAL HISTORY: No past  surgical history on file.  MEDICATIONS: Current Outpatient Medications on File Prior to Visit  Medication Sig Dispense Refill  . acetaminophen (TYLENOL) 500 MG tablet Take by mouth.    August amLODipine (NORVASC) 10 MG tablet Take 1 tablet (10 mg total) by mouth daily. 90 tablet 1  . aspirin EC 81 MG tablet Take by mouth.    Marland Kitchen BEPREVE 1.5 % SOLN SMARTSIG:1 Drop(s) In Eye(s) Twice Daily PRN    . clobetasol cream (TEMOVATE) 0.05 % Apply in affected area daily    . Docusate Sodium (DSS) 100 MG CAPS Take by mouth.    . gabapentin (NEURONTIN) 100 MG capsule     . gabapentin (NEURONTIN) 100 MG capsule Take by mouth.    . gabapentin (NEURONTIN) 100 MG capsule Take by mouth.    Marland Kitchen HYDROcodone-acetaminophen (NORCO) 5-325 MG tablet Take 1 tablet by mouth every 6 (six) hours as needed for severe pain. 20 tablet 0  . lisinopril (ZESTRIL) 20 MG tablet Take by mouth.    Marland Kitchen lisinopril (ZESTRIL) 5 MG tablet     . meclizine (ANTIVERT) 25 MG tablet Take 1 tablet (25 mg total) by mouth 3 (three) times daily as needed for dizziness. 30 tablet 0  . meloxicam (MOBIC) 15 MG tablet Take 1 tablet (15 mg total) by mouth daily. 30 tablet 2  . Multiple Vitamin (MULTIVITAMIN) tablet Take 1 tablet by mouth daily.    Marland Kitchen oxybutynin (DITROPAN XL) 15 MG 24 hr tablet     . oxybutynin (DITROPAN XL) 15 MG 24 hr tablet Take by mouth.    Marland Kitchen  oxybutynin (DITROPAN-XL) 10 MG 24 hr tablet Take 10 mg by mouth at bedtime.    Marland Kitchen oxyCODONE (ROXICODONE) 5 MG immediate release tablet Take 1 tablet (5 mg total) by mouth every 4 (four) hours as needed for severe pain. 3 tablet 0  . sildenafil (REVATIO) 20 MG tablet Take 1 tablet (20 mg total) by mouth daily as needed. 10 tablet 1  . tadalafil (CIALIS) 5 MG tablet Take by mouth.    . tamsulosin (FLOMAX) 0.4 MG CAPS capsule Take by mouth.     No current facility-administered medications on file prior to visit.    ALLERGIES: No Known Allergies  FAMILY HISTORY: No family history on  file.  SOCIAL HISTORY: Social History   Socioeconomic History  . Marital status: Married    Spouse name: Not on file  . Number of children: Not on file  . Years of education: Not on file  . Highest education level: Not on file  Occupational History  . Not on file  Tobacco Use  . Smoking status: Never Smoker  . Smokeless tobacco: Never Used  Substance and Sexual Activity  . Alcohol use: Never  . Drug use: Never  . Sexual activity: Not on file  Other Topics Concern  . Not on file  Social History Narrative  . Not on file   Social Determinants of Health   Financial Resource Strain:   . Difficulty of Paying Living Expenses: Not on file  Food Insecurity:   . Worried About Programme researcher, broadcasting/film/video in the Last Year: Not on file  . Ran Out of Food in the Last Year: Not on file  Transportation Needs:   . Lack of Transportation (Medical): Not on file  . Lack of Transportation (Non-Medical): Not on file  Physical Activity:   . Days of Exercise per Week: Not on file  . Minutes of Exercise per Session: Not on file  Stress:   . Feeling of Stress : Not on file  Social Connections:   . Frequency of Communication with Friends and Family: Not on file  . Frequency of Social Gatherings with Friends and Family: Not on file  . Attends Religious Services: Not on file  . Active Member of Clubs or Organizations: Not on file  . Attends Banker Meetings: Not on file  . Marital Status: Not on file  Intimate Partner Violence:   . Fear of Current or Ex-Partner: Not on file  . Emotionally Abused: Not on file  . Physically Abused: Not on file  . Sexually Abused: Not on file   PHYSICAL EXAM: Blood pressure (!) 175/77, pulse 74, height 5\' 8"  (1.727 m), weight 209 lb 9.6 oz (95.1 kg), SpO2 98 %. General: No acute distress.  Patient appears well-groomed.  Head:  Normocephalic/atraumatic Eyes:  fundi examined but not visualized Neck: supple, left sided suboccipital and paraspinal  tenderness, full range of motion Back: No paraspinal tenderness Heart: regular rate and rhythm Lungs: Clear to auscultation bilaterally. Vascular: No carotid bruits. Neurological Exam: Mental status: alert and oriented to person, place, and time, recent and remote memory intact, fund of knowledge intact, attention and concentration intact, speech fluent and not dysarthric, language intact. Cranial nerves: CN I: not tested CN II: pupils equal, round and reactive to light, visual fields intact CN III, IV, VI:  full range of motion, no nystagmus, no ptosis CN V: facial sensation intact CN VII: upper and lower face symmetric CN VIII: hearing intact CN IX, X: gag intact, uvula  midline CN XI: sternocleidomastoid and trapezius muscles intact CN XII: tongue midline Bulk & Tone: normal, no fasciculations. Motor:  5/5 throughout  Sensation: temperature and vibration sensation intact. Deep Tendon Reflexes:  2+ throughout, toes downgoing.  Finger to nose testing:  Without dysmetria.  Heel to shin:  Without dysmetria.  Gait:  Normal station and stride.  Romberg negative.  IMPRESSION: 1.  Left sided occipital neuralgia 2.  Cervicalgia  PLAN: 1.  Titrate gabapentin 200mg  at bedtime to 300mg  twice daily.  Once he has been on 300mg  twice daily for a month with no improvement, he is to contact me. 2.  Refer to physical therapy for neck pain 3.  If above therapy ineffective, would pursue MRI of cervical spine 4.  Follow up 4 months.  Thank you for allowing me to take part in the care of this patient.  , DO

## 2019-12-08 ENCOUNTER — Ambulatory Visit (INDEPENDENT_AMBULATORY_CARE_PROVIDER_SITE_OTHER): Payer: No Typology Code available for payment source | Admitting: Neurology

## 2019-12-08 ENCOUNTER — Encounter: Payer: Self-pay | Admitting: Neurology

## 2019-12-08 ENCOUNTER — Other Ambulatory Visit: Payer: Self-pay

## 2019-12-08 VITALS — BP 175/77 | HR 74 | Ht 68.0 in | Wt 209.6 lb

## 2019-12-08 DIAGNOSIS — M5481 Occipital neuralgia: Secondary | ICD-10-CM

## 2019-12-08 DIAGNOSIS — M542 Cervicalgia: Secondary | ICD-10-CM | POA: Diagnosis not present

## 2019-12-08 MED ORDER — GABAPENTIN 100 MG PO CAPS: ORAL_CAPSULE | ORAL | 0 refills | Status: AC

## 2019-12-08 NOTE — Patient Instructions (Signed)
1.  We will increase gabapentin 100mg  capsule:    Take 3 capsules at bedtime for one week  Then 1 capsule in morning and 3 capsules at bedtime for one week  Then 2 capsules in morning and 3 capsules at bedtime for one week  Then 3 capsules twice daily  Contact me for refill.  Once you have taken the refill for one month and pain not improved, then contact me 2.  I will refer you to physical therapy for neck pain. 3.  Follow up in 4 months.

## 2020-01-16 ENCOUNTER — Ambulatory Visit: Payer: Medicare HMO | Attending: Neurology | Admitting: Physical Therapy

## 2020-02-06 ENCOUNTER — Ambulatory Visit: Payer: Medicare HMO | Attending: Neurology | Admitting: Physical Therapy

## 2020-02-07 ENCOUNTER — Ambulatory Visit: Payer: Medicare HMO | Admitting: Neurology

## 2020-02-19 ENCOUNTER — Ambulatory Visit: Payer: Medicare HMO | Admitting: Medical

## 2020-03-01 ENCOUNTER — Ambulatory Visit: Payer: Medicare HMO | Attending: Neurology | Admitting: Physical Therapy

## 2020-06-13 ENCOUNTER — Other Ambulatory Visit: Payer: Self-pay

## 2020-06-13 ENCOUNTER — Encounter: Payer: Self-pay | Admitting: Neurology

## 2020-06-13 ENCOUNTER — Ambulatory Visit (INDEPENDENT_AMBULATORY_CARE_PROVIDER_SITE_OTHER): Payer: Medicare HMO | Admitting: Neurology

## 2020-06-13 VITALS — BP 120/70 | HR 63 | Ht 70.0 in | Wt 216.6 lb

## 2020-06-13 DIAGNOSIS — M542 Cervicalgia: Secondary | ICD-10-CM

## 2020-06-13 DIAGNOSIS — M5481 Occipital neuralgia: Secondary | ICD-10-CM | POA: Diagnosis not present

## 2020-06-13 NOTE — Progress Notes (Signed)
NEUROLOGY FOLLOW UP OFFICE NOTE  Scott Saunders [id number]  Assessment/Plan:   Cervicogenic headache/occipital neuralgia/cervical spondylosis  I told patient that he needs to follow with just one neurologist.  He has not followed my recommendations thus far.  I am in agreement with treatment plan with his other neurologist and recommended continuing care with the other neurologist and following through with the ESI.    Subjective:  Scott Saunders is a 70 year old right-handed male with HTN who presents for headache and neck pain.  History supplemented by ED and prior neurologist's notes.  In 2020, he began experiencing severe sharp pain in the left occipital region with shooting pain radiating down the neck and into the left shoulder  No burning, tingling or numbness of the scalp.  Moving his neck in all directions, especially flexion while reading, aggravates it.  No pain, numbness or weakness into the left arm or hand.  Cervical spine X-ray from 09/08/2018 showed moderate spondylosis with moderate multilevel disc disease from C3-4 level to C5-6 level.  MRI of brain from 11/05/2018 personally reviewed showed chronic small vessel ischemic changes but otherwise unremarkable.  He established care with neurology at [facility].  He received an occipital nerve block in September 2020, which resolved the pain for about 8 months but then returned.  He had a repeat occipital nerve block on 11/07/2019, but this time it was ineffective.  He was seen in the ED on 11/16/2019 where he was treated with Toradol injection, oxycodoone and Tylenol.  To further evaluate left arm pain, he underwent NCV-EMG on 12/08/2018 which was normal.  He was referred to a orthopedics for suspected shoulder etiology.  MRI of left shoulder showed evidence of tendonitis in addition to tear of the supraspinatus tendon was told that he needs shoulder surgery, but patient has declined.  He saw me in October 2021.  I advised to  increase gabapentin and referred to physical therapy, which he did not continue.  He returned to [facility] neurology.  MRI cervical spine on 01/09/2020 showed cervical spondylosis moderate to severe disc degeneration at C3-4 and C4-5, mild spinal stenosis at C3-4 and severe multilevel neural foraminal narrowing on right C3-C4, right C4-C5 and bilateral C5-C6.  He was scheduled for cervical ESI C7/T1.  PAST MEDICAL HISTORY: Past Medical History:  Diagnosis Date  . Hypertension     MEDICATIONS: Current Outpatient Medications on File Prior to Visit  Medication Sig Dispense Refill  . acetaminophen (TYLENOL) 500 MG tablet Take by mouth.    Marland Kitchen amLODipine (NORVASC) 10 MG tablet Take 1 tablet (10 mg total) by mouth daily. 90 tablet 1  . aspirin EC 81 MG tablet Take by mouth.    Marland Kitchen BEPREVE 1.5 % SOLN SMARTSIG:1 Drop(s) In Eye(s) Twice Daily PRN    . clobetasol cream (TEMOVATE) 0.05 % Apply in affected area daily    . Docusate Sodium (DSS) 100 MG CAPS Take by mouth.    . gabapentin (NEURONTIN) 100 MG capsule Take 3 capsules at bedtime for one week, then 1 capsule in AM and 3 capsules at bedtime for one week, then 2 capsules in AM and 3 capsules at bedtime for one week, then 3 capsules twice daily. 180 capsule 0  . HYDROcodone-acetaminophen (NORCO) 5-325 MG tablet Take 1 tablet by mouth every 6 (six) hours as needed for severe pain. (Patient not taking: Reported on 12/08/2019) 20 tablet 0  . lisinopril (ZESTRIL) 20 MG tablet Take by mouth.    Marland Kitchen lisinopril (  ZESTRIL) 5 MG tablet     . meclizine (ANTIVERT) 25 MG tablet Take 1 tablet (25 mg total) by mouth 3 (three) times daily as needed for dizziness. 30 tablet 0  . meloxicam (MOBIC) 15 MG tablet Take 1 tablet (15 mg total) by mouth daily. 30 tablet 2  . Multiple Vitamin (MULTIVITAMIN) tablet Take 1 tablet by mouth daily.    Marland Kitchen oxybutynin (DITROPAN XL) 15 MG 24 hr tablet  (Patient not taking: Reported on 12/08/2019)    . oxybutynin (DITROPAN XL) 15 MG 24  hr tablet Take by mouth. (Patient not taking: Reported on 12/08/2019)    . oxybutynin (DITROPAN-XL) 10 MG 24 hr tablet Take 10 mg by mouth at bedtime. (Patient not taking: Reported on 12/08/2019)    . oxyCODONE (ROXICODONE) 5 MG immediate release tablet Take 1 tablet (5 mg total) by mouth every 4 (four) hours as needed for severe pain. (Patient not taking: Reported on 12/08/2019) 3 tablet 0  . sildenafil (REVATIO) 20 MG tablet Take 1 tablet (20 mg total) by mouth daily as needed. 10 tablet 1  . tadalafil (CIALIS) 5 MG tablet Take by mouth.    . tamsulosin (FLOMAX) 0.4 MG CAPS capsule Take by mouth. (Patient not taking: Reported on 12/08/2019)     No current facility-administered medications on file prior to visit.    ALLERGIES: No Known Allergies  FAMILY HISTORY: No family history on file.    Objective:  Blood pressure 120/70, pulse 63, height 5\' 10"  (1.778 m), weight 216 lb 9.6 oz (98.2 kg), SpO2 97 %. General: No acute distress.  Patient appears well-groomed.     , DO

## 2021-04-04 IMAGING — MR MR HEAD W/O CM
12 series · 48 of 48 positions shown · non-contrast
Comparison: Head CT 11/05/2018

CLINICAL DATA: Left-sided head, facial, and neck pain for 2 days.
Vertigo.

EXAM:
MRI HEAD WITHOUT CONTRAST
TECHNIQUE: Multiplanar, multiecho pulse sequences of the brain and surrounding
structures were obtained without intravenous contrast.

[Series 2: T1 · sagittal · 5.0mm · 0.47mm/px · 2 of 23 slices shown]
[im 1/23]
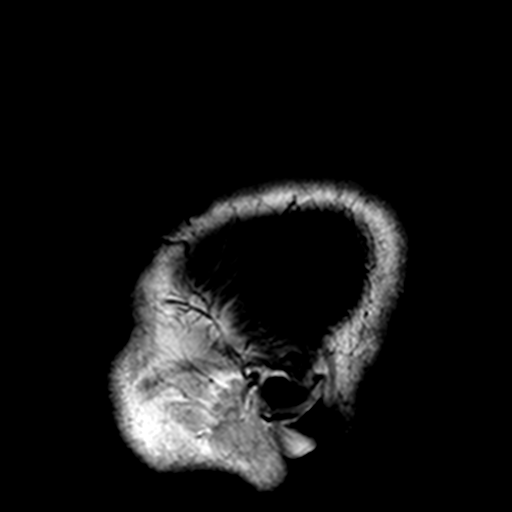
[im 23/23]
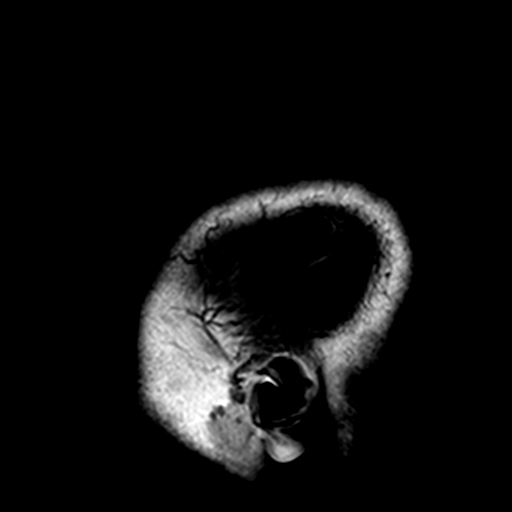

[Series 3: DWI · axial · 3.0mm · 1.86mm/px · z∈[-61,+94]mm · 9 of 96 slices shown (1 of 4)]
[im 1/96]
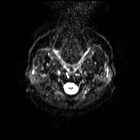
[im 12/96]
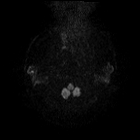
[im 24/96]
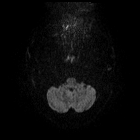
[im 36/96]
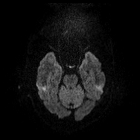
[im 48/96]
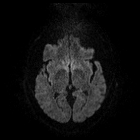
[im 60/96]
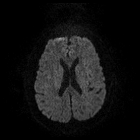
[im 72/96]
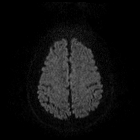
[im 84/96]
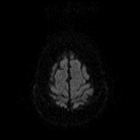
[im 96/96]
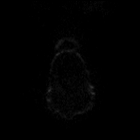

[Series 4: DWI · axial · 3.0mm · 1.86mm/px · z∈[-61,+94]mm · 4 of 45 slices shown (2 of 4)]
[im 1/45]
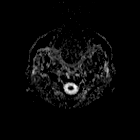
[im 15/45]
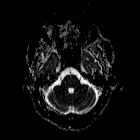
[im 30/45]
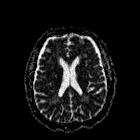
[im 45/45]
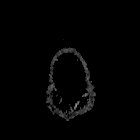

[Series 5: DWI · coronal · 3.0mm · 1.46mm/px · 9 of 100 slices shown (3 of 4)]
[im 1/100]
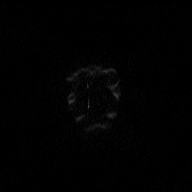
[im 13/100]
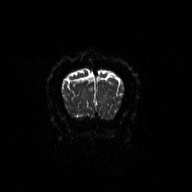
[im 25/100]
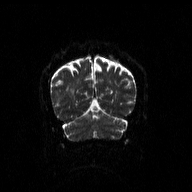
[im 38/100]
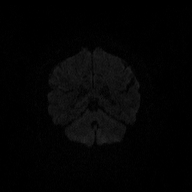
[im 50/100]
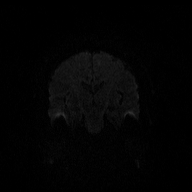
[im 62/100]
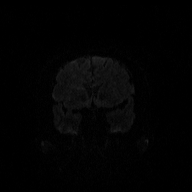
[im 75/100]
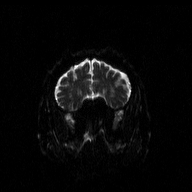
[im 87/100]
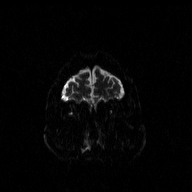
[im 100/100]
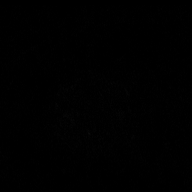

[Series 6: DWI · coronal · 3.0mm · 1.46mm/px · 4 of 47 slices shown (4 of 4)]
[im 1/47]
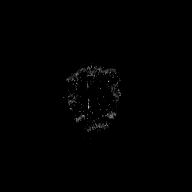
[im 16/47]
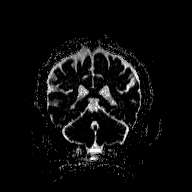
[im 31/47]
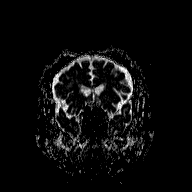
[im 47/47]
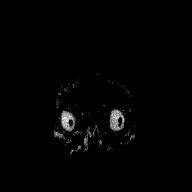

[Series 7: T2 · axial · 5.0mm · 0.45mm/px · z∈[-61,+100]mm · 2 of 24 slices shown (1 of 2)]
[im 1/24]
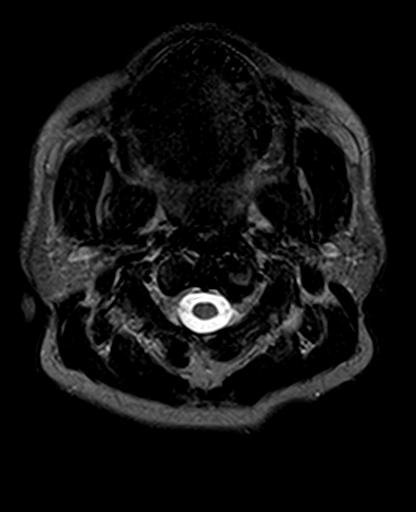
[im 24/24]
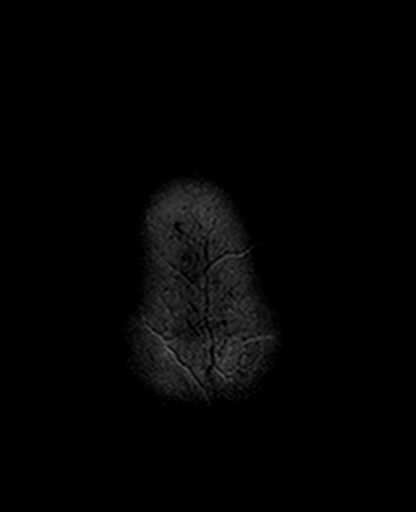

[Series 8: T2 · axial · 5.0mm · 0.45mm/px · z∈[-61,+100]mm · 2 of 24 slices shown (2 of 2)]
[im 1/24]
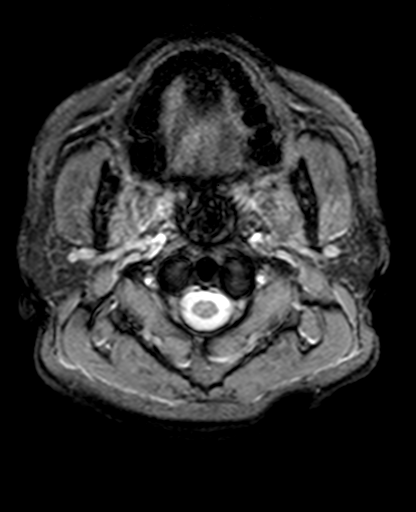
[im 24/24]
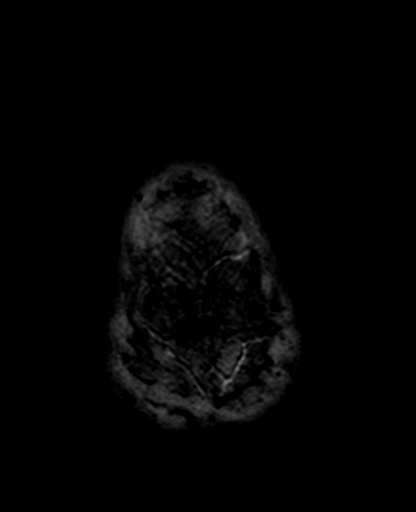

[Series 9: FLAIR · axial · 3.0mm · 0.45mm/px · z∈[-60,+100]mm · 4 of 41 slices shown]
[im 1/41]
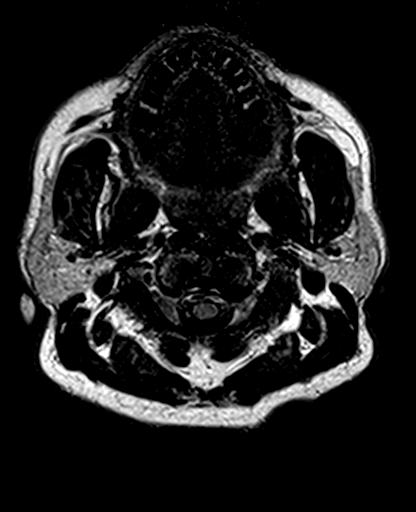
[im 14/41]
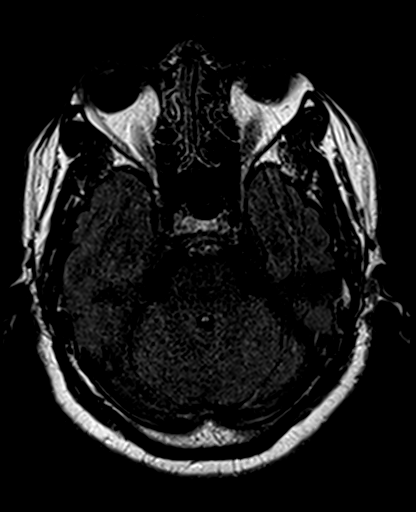
[im 27/41]
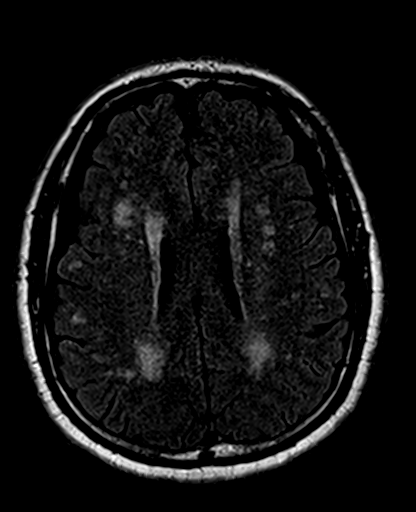
[im 41/41]
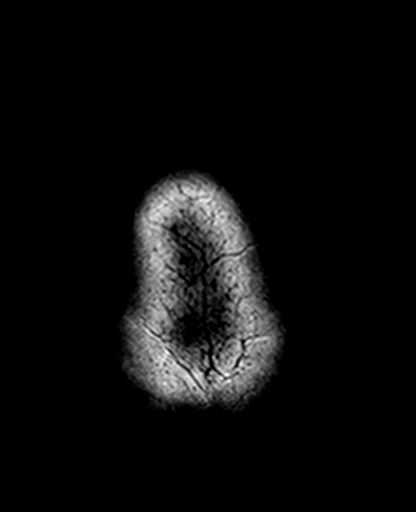

[Series 10: t1_3d_tra · axial · 2.0mm · 0.94mm/px · z∈[-59,+99]mm · 7 of 80 slices shown]
[im 1/80]
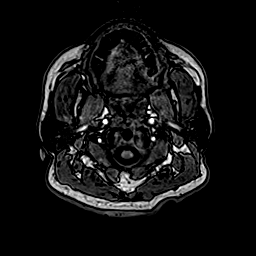
[im 14/80]
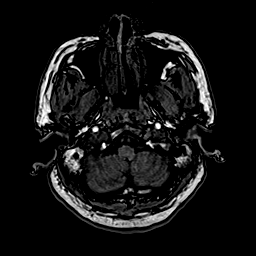
[im 27/80]
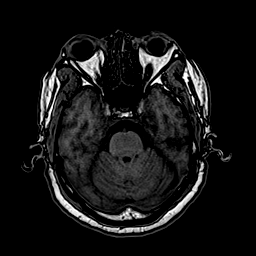
[im 40/80]
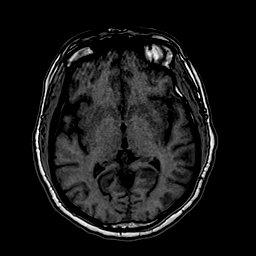
[im 53/80]
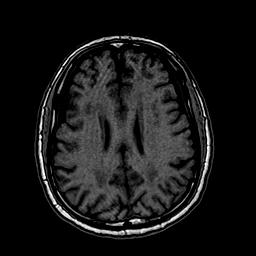
[im 66/80]
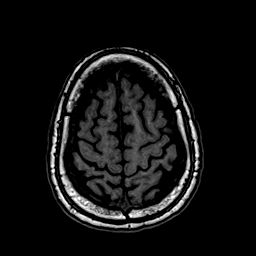
[im 80/80]
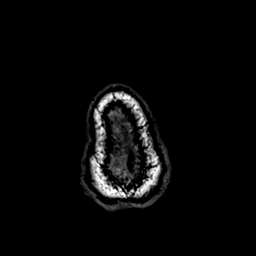

[Series 11: T2 post-contrast · coronal · 5.0mm · 0.90mm/px · 3 of 28 slices shown]
[im 1/28]
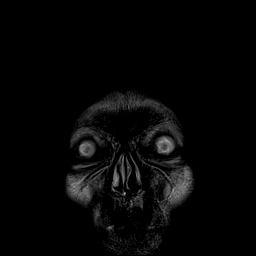
[im 14/28]
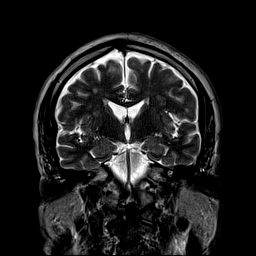
[im 28/28]
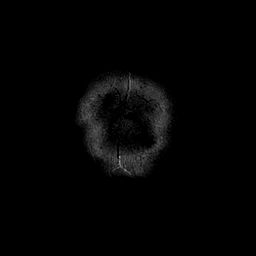

[Series 100: hx · axial · 10.0mm · 0.55mm/px · 1 of 9 slices shown (1 of 2)]
[im 1/9]
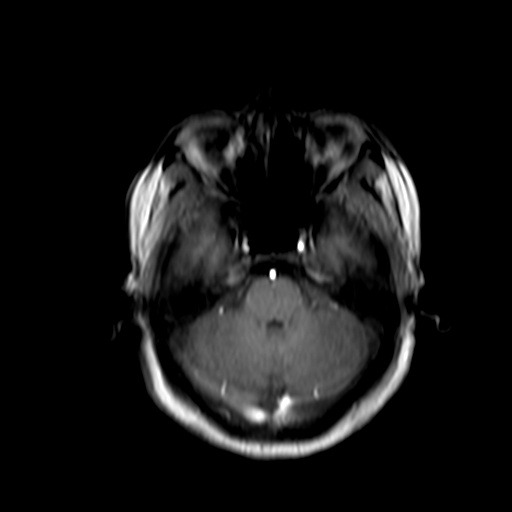

[Series 101: hx · axial · 10.0mm · 0.55mm/px · 1 of 9 slices shown (2 of 2)]
[im 1/9]
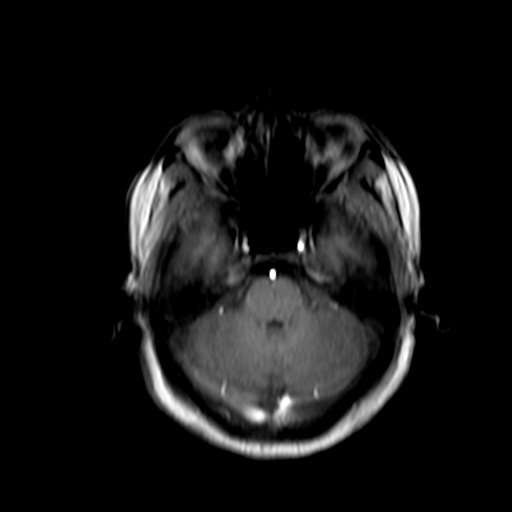

[48 of 48 positions shown; findings below may reference images not displayed]

FINDINGS: Brain: There is no evidence of acute infarct, intracranial
hemorrhage, mass, midline shift, or extra-axial fluid collection.
The ventricles and sulci are within normal limits for age. Patchy T2
hyperintensities in the cerebral white matter bilaterally are
nonspecific but compatible with moderate chronic small vessel
ischemic disease. No cerebellar insult is identified.

Vascular: Major intracranial vascular flow voids are preserved.

Skull and upper cervical spine: Unremarkable bone marrow signal.

Sinuses/Orbits: Unremarkable orbits. Paranasal sinuses and mastoid
air cells are clear.

Other: None.
IMPRESSION: 1. No acute intracranial abnormality.
2. Moderate chronic small vessel ischemic disease.
